# Patient Record
Sex: Female | Born: 1990 | Race: White | Hispanic: No | Marital: Single | Smoking: Never smoker
Health system: Southern US, Community
[De-identification: ages and names within clinical notes are randomized; demographics above are authoritative.]

## PROBLEM LIST (undated history)

## (undated) DIAGNOSIS — J45909 Unspecified asthma, uncomplicated: Secondary | ICD-10-CM

## (undated) HISTORY — DX: Unspecified asthma, uncomplicated: J45.909

---
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
## 2010-03-17 IMAGING — CR DG CHEST 2V
1 series · 5 of 5 positions shown · non-contrast
Comparison: NONE

CLINICAL DATA: Right anterior rib pain. 

CHEST TWO VIEW (PA AND LATERAL)

[Series 1: view not recorded · 0.17mm/px · 5 of 5 slices shown]
[im 1/5]
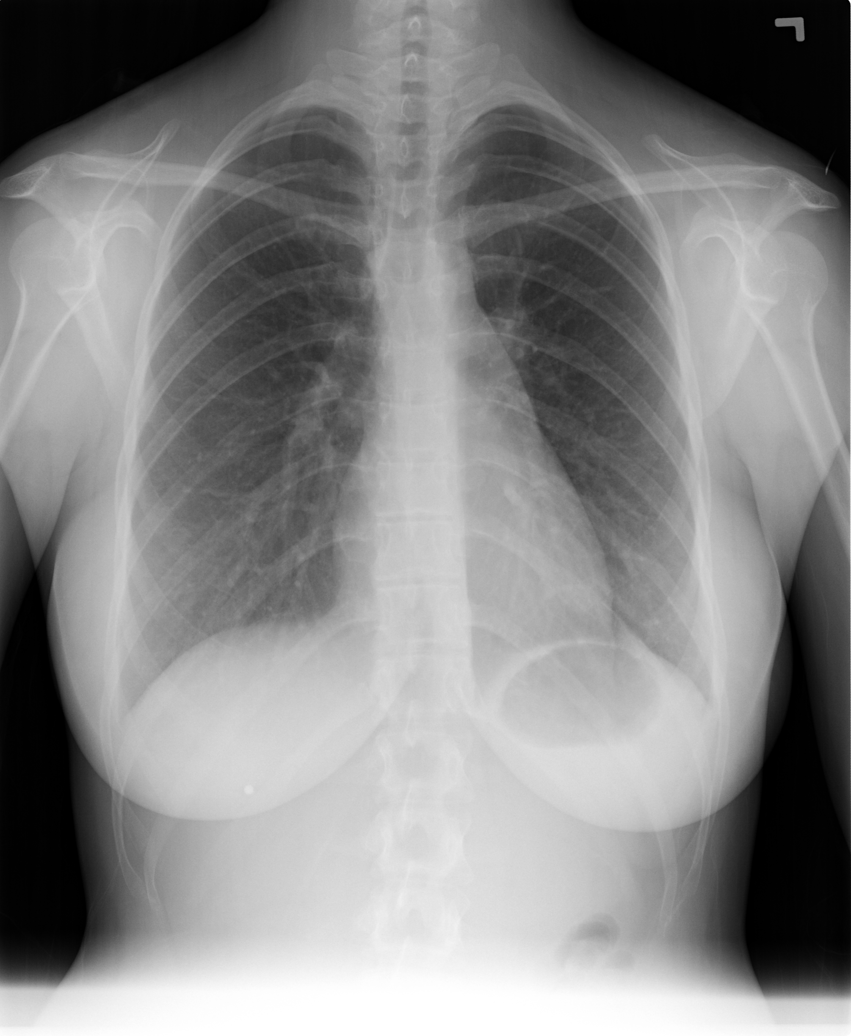
[im 2/5]
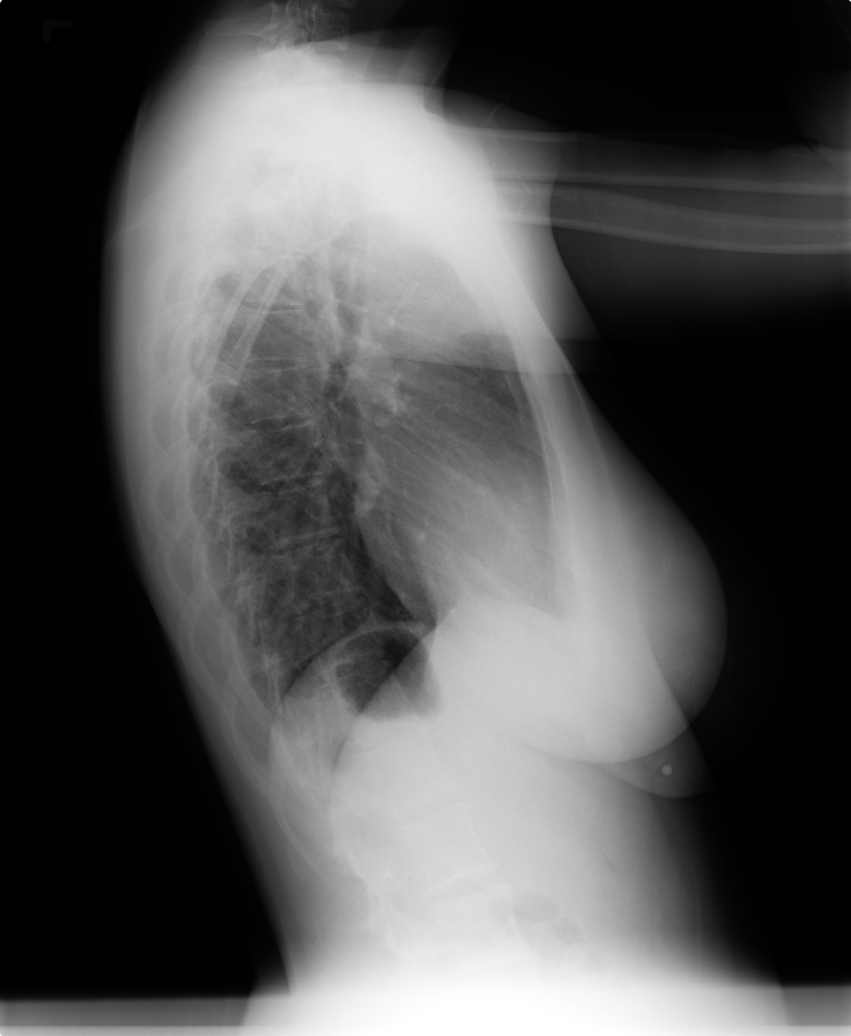
[im 3/5]
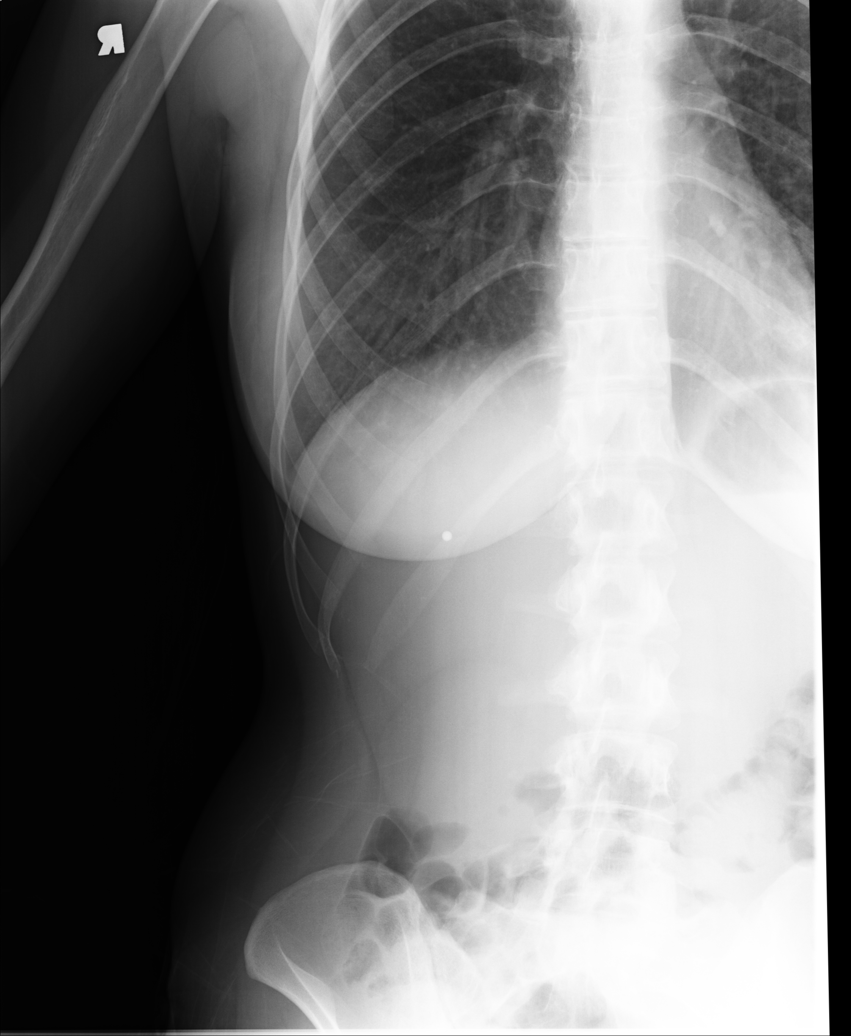
[im 4/5]
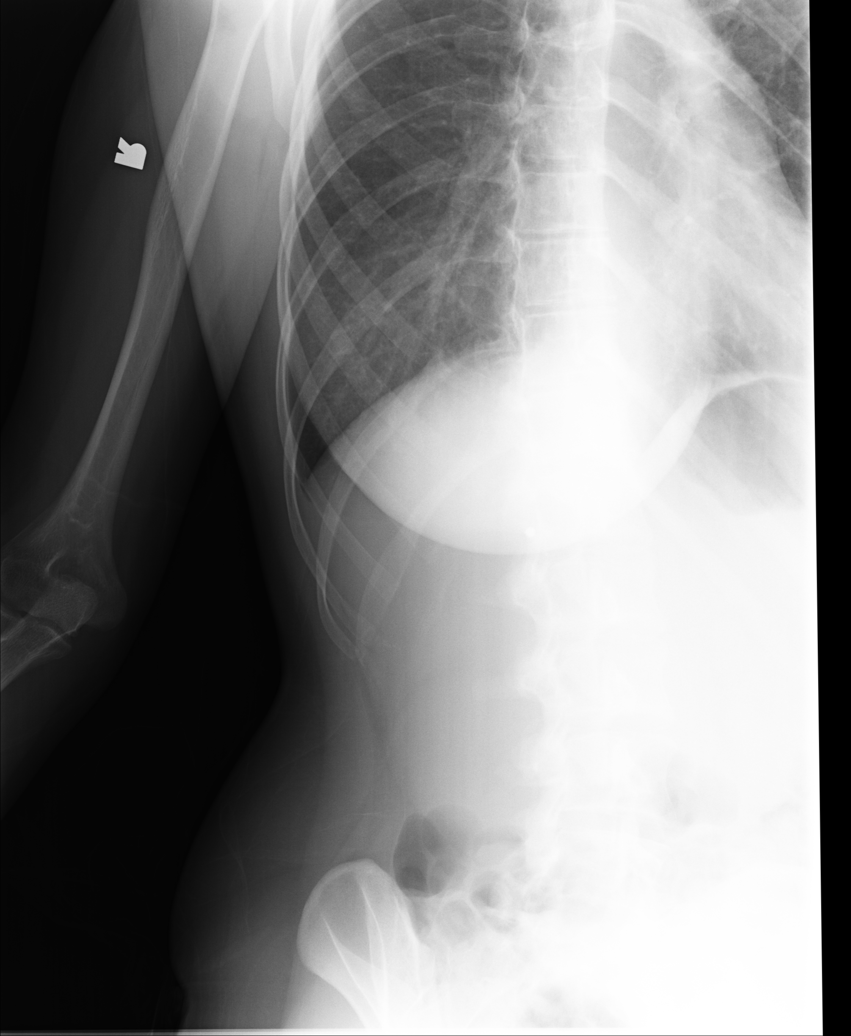
[im 5/5]
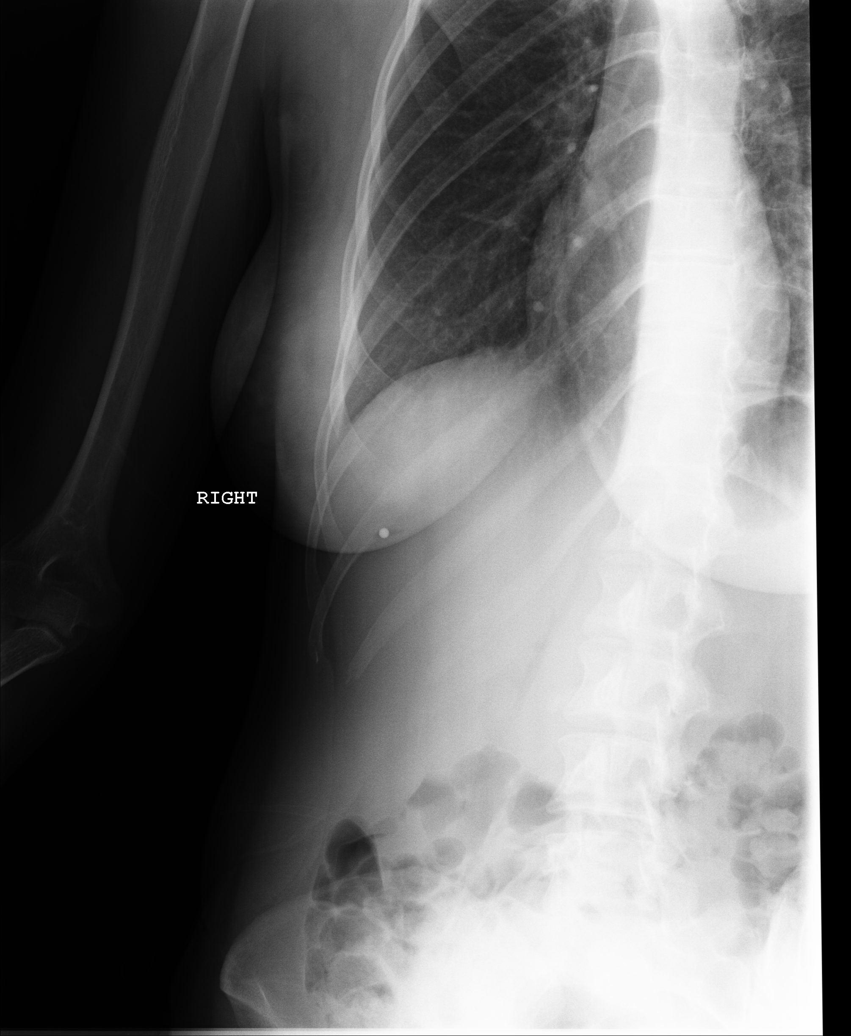

[5 of 5 positions shown; findings below may reference images not displayed]

FINDINGS: The lungs are clear and well expanded.   Heart and 
pulmonary vessels are normal.  No significant abnormalities are 
noted in the regional skeleton. 

electronically reviewed on 01/02/2008 Dict Date: 01/02/2008  Tran 
Date:  01/02/2008 YAMUANT ITICHA

## 2013-05-13 ENCOUNTER — Other Ambulatory Visit (HOSPITAL_COMMUNITY)
Admission: RE | Admit: 2013-05-13 | Discharge: 2013-05-13 | Disposition: A | Discharge status: Home / Self Care | Payer: 59 | Source: Ambulatory Visit | Attending: Family Medicine | Admitting: Family Medicine

## 2013-05-13 ENCOUNTER — Other Ambulatory Visit: Payer: Self-pay | Admitting: Family Medicine

## 2013-05-13 DIAGNOSIS — Z124 Encounter for screening for malignant neoplasm of cervix: Secondary | ICD-10-CM | POA: Insufficient documentation

## 2013-05-13 DIAGNOSIS — Z113 Encounter for screening for infections with a predominantly sexual mode of transmission: Secondary | ICD-10-CM | POA: Insufficient documentation

## 2015-08-20 ENCOUNTER — Other Ambulatory Visit: Payer: Self-pay | Admitting: Family Medicine

## 2015-08-20 ENCOUNTER — Other Ambulatory Visit (HOSPITAL_COMMUNITY)
Admission: RE | Admit: 2015-08-20 | Discharge: 2015-08-20 | Disposition: A | Discharge status: Home / Self Care | Payer: BLUE CROSS/BLUE SHIELD | Source: Ambulatory Visit | Attending: Family Medicine | Admitting: Family Medicine

## 2015-08-20 DIAGNOSIS — Z01411 Encounter for gynecological examination (general) (routine) with abnormal findings: Secondary | ICD-10-CM | POA: Diagnosis present

## 2015-08-25 LAB — CYTOLOGY - PAP

## 2016-12-10 ENCOUNTER — Emergency Department (HOSPITAL_COMMUNITY)
Admission: EM | Admit: 2016-12-10 | Discharge: 2016-12-10 | Disposition: A | Discharge status: Home / Self Care | Payer: Worker's Compensation | Attending: Emergency Medicine | Admitting: Emergency Medicine

## 2016-12-10 DIAGNOSIS — W540XXA Bitten by dog, initial encounter: Secondary | ICD-10-CM | POA: Diagnosis not present

## 2016-12-10 DIAGNOSIS — S0125XA Open bite of nose, initial encounter: Secondary | ICD-10-CM | POA: Diagnosis present

## 2016-12-10 DIAGNOSIS — Y939 Activity, unspecified: Secondary | ICD-10-CM | POA: Diagnosis not present

## 2016-12-10 DIAGNOSIS — Y999 Unspecified external cause status: Secondary | ICD-10-CM | POA: Insufficient documentation

## 2016-12-10 DIAGNOSIS — S0121XA Laceration without foreign body of nose, initial encounter: Secondary | ICD-10-CM | POA: Diagnosis not present

## 2016-12-10 DIAGNOSIS — Y929 Unspecified place or not applicable: Secondary | ICD-10-CM | POA: Diagnosis not present

## 2016-12-10 MED ORDER — FLUCONAZOLE 150 MG PO TABS: 150.0000 mg | ORAL_TABLET | Freq: Every day | ORAL | 0 refills | Status: AC

## 2016-12-10 MED ORDER — AMOXICILLIN-POT CLAVULANATE 875-125 MG PO TABS: 1.0000 | ORAL_TABLET | Freq: Two times a day (BID) | ORAL | 0 refills | Status: DC

## 2016-12-10 MED ORDER — LIDOCAINE-EPINEPHRINE (PF) 2 %-1:200000 IJ SOLN
10.0000 mL | Freq: Once | INTRAMUSCULAR | Status: AC
  Administered 2016-12-10: 10 mL via INTRADERMAL
  Filled 2016-12-10: qty 20

## 2016-12-10 NOTE — ED Triage Notes (Addendum)
Pt here for dog bite. Pt works at [employer] and was working with a terrier and the dog bit her in the nose. Pt has skin flap on nose, seen at [facility] and sent here stating "they sent me here to see if I need sutures and possibly a plastic surgeon" VSS. Pt was given rocephin. Dog was up to date on vaccinations.

## 2016-12-10 NOTE — Discharge Instructions (Signed)
Please read and follow all provided instructions.  Your diagnoses today include:  1. Dog bite, initial encounter   2. Laceration of nose, initial encounter     Tests performed today include: X-ray of the affected area that did not show any foreign bodies or broken bones Vital signs. See below for your results today.   Medications prescribed:   Take any prescribed medications only as directed.   Home care instructions:  Follow any educational materials and wound care instructions contained in this packet.   You may shower and wash the area with soap and water, just be sure to pat the area dry and not rub over the stitches. Do no put your stiches underwater (in a bath, pool, or lake). Getting stiches wet can slow down healing and increase your chances of getting an infection. You may apply Bacitracin or Neosporin twice a day for 7 days, and keep the ara clean with  bandage or gauze. Do not apply alcohol or hydrogen peroxide. Cover the area if it draining or weeping.   Follow-up instructions: Suture Removal: Return to the Emergency Department or see your primary care care doctor in 5-7 days for a recheck of your wound and removal of your sutures or staples.    Return instructions:  Return to the Emergency Department if you have: Fever Worsening pain Worsening swelling of the wound Pus draining from the wound Redness of the skin that moves away from the wound, especially if it streaks away from the affected area  Any other emergent concerns  Your vital signs today were: BP 126/89 (BP Location: Left Arm)    Pulse (!) 108    Temp 98.3 F (36.8 C) (Oral)    Resp 18    Ht  (1.6 m)    Wt 69.9 kg    LMP 11/03/2016 (Approximate)    SpO2 95%    BMI 27.28 kg/m  If your blood pressure (BP) was elevated above 135/85 this visit, please have this repeated by your doctor within one month. --------------

## 2016-12-10 NOTE — ED Provider Notes (Signed)
[facility]-EMERGENCY DEPT Provider Note   CSN: [id number] Arrival date & time: 12/10/16  1941   By signing my name below, I, Teofilo Pod, attest that this documentation has been prepared under the direction and in the presence of Audry Pili, PA-C. Electronically Signed: Teofilo Pod, ED Scribe. 12/10/2016. 8:12 PM.   History   Chief Complaint Chief Complaint  Patient presents with  . Animal Bite    The history is provided by the patient. No language interpreter was used.   HPI Comments:  Ruth Mendez is a 26 y.o. female who presents to the Emergency Department complaining of a dog bite to her nose that she sustained 5 hours ago. Pt reports that she was handling a terrier at [employer] and the dog bit on the nose. Pt was seen at [facility] after the bite and was given rocephin and was referred to the ED. Pt's vaccinations UTD. The dog was up to date on its vaccinations. Bleeding controlled with a pressure dressing. Pt denies other associated symptoms.   No past medical history on file.  There are no active problems to display for this patient.   No past surgical history on file.  OB History    No data available       Home Medications    Prior to Admission medications   Not on File    Family History No family history on file.  Social History Social History  Substance Use Topics  . Smoking status: Not on file  . Smokeless tobacco: Not on file  . Alcohol use Not on file     Allergies   Patient has no known allergies.   Review of Systems Review of Systems  Constitutional: Negative for fever.  HENT: Negative for nosebleeds.   Skin: Positive for wound.     Physical Exam Updated Vital Signs BP 126/89 (BP Location: Left Arm)   Pulse (!) 108   Temp 98.3 F (36.8 C) (Oral)   Resp 18   Ht  (1.6 m)   Wt 154 lb (69.9 kg)   LMP 11/03/2016 (Approximate)   SpO2 95%   BMI 27.28 kg/m   Physical Exam  Constitutional: She appears  well-developed and well-nourished. No distress.  HENT:  Head: Normocephalic and atraumatic.  1cm jagged superficial laceration on tip of nose. Minor skin evulsion. Bottom of wound visualized. Bleeding controlled.   Eyes: Conjunctivae are normal.  Cardiovascular: Normal rate.   Pulmonary/Chest: Effort normal.  Abdominal: She exhibits no distension.  Neurological: She is alert.  Skin: Skin is warm and dry.  Psychiatric: She has a normal mood and affect.  Nursing note and vitals reviewed.  ED Treatments / Results  DIAGNOSTIC STUDIES:  Oxygen Saturation is 95% on RA, normal by my interpretation.    COORDINATION OF CARE:  8:10 PM Discussed treatment plan with pt at bedside and pt agreed to plan.   Labs (all labs ordered are listed, but only abnormal results are displayed) Labs Reviewed - No data to display  EKG  EKG Interpretation None       Radiology No results found.  Procedures .Marland KitchenLaceration Repair Date/Time: 12/10/2016 9:23 PM Performed by: Audry Pili Authorized by: Audry Pili   Consent:    Consent obtained:  Verbal   Consent given by:  Patient Laceration details:    Location:  Face   Face location:  Nose   Length (cm):  1 Repair type:    Repair type:  Simple Skin repair:  Repair method:  Sutures   Suture size:  6-0   Suture material:  Prolene   Number of sutures:  6 Approximation:    Approximation:  Close    (including critical care time)  Medications Ordered in ED Medications  lidocaine-EPINEPHrine (XYLOCAINE W/EPI) 2 %-1:200000 (PF) injection 10 mL (10 mLs Intradermal Given 12/10/16 2018)     Initial Impression / Assessment and Plan / ED Course  I have reviewed the triage vital signs and the nursing notes.  Pertinent labs & imaging results that were available during my care of the patient were reviewed by me and considered in my medical decision making (see chart for details).  Final Clinical Impressions(s) / ED Diagnoses     {I have  reviewed the relevant previous healthcare records.  {I obtained HPI from historian.   ED Course:  Assessment: Patient is a 26 y.o. female that presents with laceration to tip of nose. Tdap booster UTD. Pressure irrigation performed. Bottom of the wound visualized with bleeding controled. Laceration occurred < 8 hours prior to repair which was well tolerated. Allowed loose suturing to provide drainage. Pt has no co morbidities to effect normal wound healing. Discussed suture home care w pt and answered questions. Pt to f-u for wound check and suture removal in 5-7 days. Six 6-0 Prolene used.  Given Rx Augmentin. Pt is hemodynamically stable w no complaints prior to dc.    Disposition/Plan:  DC Home Additional Verbal discharge instructions given and discussed with patient.  Pt Instructed to f/u with PCP in the next week for evaluation and treatment of symptoms. Return precautions given Pt acknowledges and agrees with plan  Supervising Physician Alvira Monday, MD  Final diagnoses:  Dog bite, initial encounter  Laceration of nose, initial encounter    New Prescriptions New Prescriptions   No medications on file   I personally performed the services described in this documentation, which was scribed in my presence. The recorded information has been reviewed and is accurate.      Audry Pili, PA-C 12/10/16 0454    Alvira Monday, MD 12/12/16 1212

## 2018-05-28 DIAGNOSIS — F900 Attention-deficit hyperactivity disorder, predominantly inattentive type: Secondary | ICD-10-CM | POA: Diagnosis not present

## 2018-05-28 DIAGNOSIS — N3000 Acute cystitis without hematuria: Secondary | ICD-10-CM | POA: Diagnosis not present

## 2018-09-03 DIAGNOSIS — J988 Other specified respiratory disorders: Secondary | ICD-10-CM | POA: Diagnosis not present

## 2018-09-17 DIAGNOSIS — R0789 Other chest pain: Secondary | ICD-10-CM | POA: Diagnosis not present

## 2018-09-17 DIAGNOSIS — R05 Cough: Secondary | ICD-10-CM | POA: Diagnosis not present

## 2018-09-18 DIAGNOSIS — R0789 Other chest pain: Secondary | ICD-10-CM | POA: Diagnosis not present

## 2018-09-25 ENCOUNTER — Other Ambulatory Visit: Payer: Self-pay | Admitting: Family Medicine

## 2018-09-25 ENCOUNTER — Other Ambulatory Visit (HOSPITAL_COMMUNITY)
Admission: RE | Admit: 2018-09-25 | Discharge: 2018-09-25 | Disposition: A | Discharge status: Home / Self Care | Payer: BLUE CROSS/BLUE SHIELD | Source: Ambulatory Visit | Attending: Family Medicine | Admitting: Family Medicine

## 2018-09-25 DIAGNOSIS — Z01411 Encounter for gynecological examination (general) (routine) with abnormal findings: Secondary | ICD-10-CM | POA: Insufficient documentation

## 2018-09-25 DIAGNOSIS — Z79899 Other long term (current) drug therapy: Secondary | ICD-10-CM | POA: Diagnosis not present

## 2018-09-25 DIAGNOSIS — Z0000 Encounter for general adult medical examination without abnormal findings: Secondary | ICD-10-CM | POA: Diagnosis not present

## 2018-10-01 LAB — CYTOLOGY - PAP: Diagnosis: NEGATIVE

## 2019-06-13 DIAGNOSIS — M778 Other enthesopathies, not elsewhere classified: Secondary | ICD-10-CM | POA: Diagnosis not present

## 2019-10-01 DIAGNOSIS — Z309 Encounter for contraceptive management, unspecified: Secondary | ICD-10-CM | POA: Diagnosis not present

## 2019-12-23 DIAGNOSIS — R109 Unspecified abdominal pain: Secondary | ICD-10-CM | POA: Diagnosis not present

## 2019-12-24 ENCOUNTER — Ambulatory Visit
Admission: RE | Admit: 2019-12-24 | Discharge: 2019-12-24 | Disposition: A | Discharge status: Home / Self Care | Payer: BLUE CROSS/BLUE SHIELD | Source: Ambulatory Visit | Attending: Family Medicine | Admitting: Family Medicine

## 2019-12-24 ENCOUNTER — Other Ambulatory Visit: Payer: Self-pay

## 2019-12-24 ENCOUNTER — Other Ambulatory Visit: Payer: Self-pay | Admitting: Family Medicine

## 2019-12-24 DIAGNOSIS — R52 Pain, unspecified: Secondary | ICD-10-CM

## 2019-12-24 DIAGNOSIS — R109 Unspecified abdominal pain: Secondary | ICD-10-CM | POA: Diagnosis not present

## 2020-02-11 DIAGNOSIS — R480 Dyslexia and alexia: Secondary | ICD-10-CM | POA: Diagnosis not present

## 2020-02-11 DIAGNOSIS — F810 Specific reading disorder: Secondary | ICD-10-CM | POA: Diagnosis not present

## 2020-03-27 DIAGNOSIS — Z20822 Contact with and (suspected) exposure to covid-19: Secondary | ICD-10-CM | POA: Diagnosis not present

## 2020-05-18 DIAGNOSIS — Z20822 Contact with and (suspected) exposure to covid-19: Secondary | ICD-10-CM | POA: Diagnosis not present

## 2020-09-28 DIAGNOSIS — E559 Vitamin D deficiency, unspecified: Secondary | ICD-10-CM | POA: Diagnosis not present

## 2020-09-28 DIAGNOSIS — Z0000 Encounter for general adult medical examination without abnormal findings: Secondary | ICD-10-CM | POA: Diagnosis not present

## 2020-09-28 DIAGNOSIS — E7800 Pure hypercholesterolemia, unspecified: Secondary | ICD-10-CM | POA: Diagnosis not present

## 2021-03-18 DIAGNOSIS — R509 Fever, unspecified: Secondary | ICD-10-CM | POA: Diagnosis not present

## 2021-03-18 DIAGNOSIS — Z20822 Contact with and (suspected) exposure to covid-19: Secondary | ICD-10-CM | POA: Diagnosis not present

## 2021-03-18 DIAGNOSIS — B349 Viral infection, unspecified: Secondary | ICD-10-CM | POA: Diagnosis not present

## 2021-03-18 DIAGNOSIS — J029 Acute pharyngitis, unspecified: Secondary | ICD-10-CM | POA: Diagnosis not present

## 2021-03-21 DIAGNOSIS — J0100 Acute maxillary sinusitis, unspecified: Secondary | ICD-10-CM | POA: Diagnosis not present

## 2021-03-21 DIAGNOSIS — R062 Wheezing: Secondary | ICD-10-CM | POA: Diagnosis not present

## 2021-07-07 DIAGNOSIS — R051 Acute cough: Secondary | ICD-10-CM | POA: Diagnosis not present

## 2021-07-07 DIAGNOSIS — R509 Fever, unspecified: Secondary | ICD-10-CM | POA: Diagnosis not present

## 2021-07-07 DIAGNOSIS — Z20822 Contact with and (suspected) exposure to covid-19: Secondary | ICD-10-CM | POA: Diagnosis not present

## 2021-07-08 ENCOUNTER — Other Ambulatory Visit: Payer: Self-pay

## 2021-07-08 ENCOUNTER — Emergency Department (HOSPITAL_BASED_OUTPATIENT_CLINIC_OR_DEPARTMENT_OTHER)
Admission: EM | Admit: 2021-07-08 | Discharge: 2021-07-08 | Disposition: A | Discharge status: Home / Self Care | Payer: BC Managed Care – PPO | Attending: Emergency Medicine | Admitting: Emergency Medicine

## 2021-07-08 ENCOUNTER — Encounter (HOSPITAL_BASED_OUTPATIENT_CLINIC_OR_DEPARTMENT_OTHER): Payer: Self-pay

## 2021-07-08 DIAGNOSIS — R000 Tachycardia, unspecified: Secondary | ICD-10-CM | POA: Insufficient documentation

## 2021-07-08 DIAGNOSIS — Z20822 Contact with and (suspected) exposure to covid-19: Secondary | ICD-10-CM | POA: Diagnosis not present

## 2021-07-08 DIAGNOSIS — R509 Fever, unspecified: Secondary | ICD-10-CM | POA: Diagnosis not present

## 2021-07-08 DIAGNOSIS — J101 Influenza due to other identified influenza virus with other respiratory manifestations: Secondary | ICD-10-CM | POA: Insufficient documentation

## 2021-07-08 LAB — RESP PANEL BY RT-PCR (FLU A&B, COVID) ARPGX2
Influenza A by PCR: POSITIVE — AB
Influenza B by PCR: NEGATIVE
SARS Coronavirus 2 by RT PCR: NEGATIVE

## 2021-07-08 MED ORDER — IBUPROFEN 800 MG PO TABS
800.0000 mg | ORAL_TABLET | Freq: Once | ORAL | Status: AC
  Administered 2021-07-08: 14:00:00 800 mg via ORAL
  Filled 2021-07-08: qty 1

## 2021-07-08 MED ORDER — ACETAMINOPHEN 325 MG PO TABS
650.0000 mg | ORAL_TABLET | Freq: Once | ORAL | Status: AC | PRN
  Administered 2021-07-08: 12:00:00 650 mg via ORAL

## 2021-07-08 NOTE — ED Provider Notes (Signed)
[facility] EMERGENCY DEPT Provider Note   CSN: [id number] Arrival date & time: 07/08/21  1159     History Chief Complaint  Patient presents with   Fever    Ruth Mendez is a 30 y.o. female.  HPI Patient reports that she started getting sick yesterday.  She has had a mild cough and was having body aches and fevers.  She reports mild sore throat.  No vomiting.  Patient reports she is tolerating liquids.  She denies she is getting dyspnea on exertion.  She does not feel short of breath or have chest pain.  Patient reports she was seen at urgent care yesterday and tested negative for flu at that time.  She was febrile and advised to control fever and hydrate.  If heart rate still elevated she was advised to return for recheck.  She reports heart rate has still been elevated at times up to 120s or 130s..  She does not feel lightheaded or near syncopal with this heart rate.  She was sent to the emergency department for further evaluation for persisting tachycardia.  Patient denies any significant medical history.  She denies any history of asthma lung or heart disease.  She reports she does have polycystic ovary and    History reviewed. No pertinent past medical history.  There are no problems to display for this patient.   History reviewed. No pertinent surgical history.   OB History   No obstetric history on file.     No family history on file.  Social History   Tobacco Use   Smoking status: Never   Smokeless tobacco: Never  Substance Use Topics   Alcohol use: Yes    Comment: Occasionally   Drug use: Never    Home Medications Prior to Admission medications   Medication Sig Start Date End Date Taking? Authorizing Provider  norethindrone-ethinyl estradiol-FE (LOESTRIN FE) 1-20 MG-MCG tablet Take 1 tablet by mouth daily. 02/16/21  Yes [provider]  sodium fluoride (FLUORISHIELD) 1.1 % GEL dental gel See admin instructions. 02/26/21  Yes [provider]  amoxicillin-clavulanate (AUGMENTIN) 875-125 MG tablet Take 1 tablet by mouth every 12 (twelve) hours. Patient not taking: Reported on 07/08/2021 12/10/16   Audry Pili, PA-C    Allergies    Patient has no known allergies.  Review of Systems   Review of Systems 10 systems reviewed and negative except as per HPI Physical Exam Updated Vital Signs BP 126/80   Pulse (!) 124   Temp 99.5 F (37.5 C) (Oral)   Resp (!) 24   Ht 5\' 3"  (1.6 m)   Wt 79.4 kg   SpO2 94%   BMI 31.00 kg/m   Physical Exam Constitutional:      Appearance: Normal appearance.  HENT:     Mouth/Throat:     Pharynx: Oropharynx is clear.  Eyes:     Extraocular Movements: Extraocular movements intact.  Cardiovascular:     Comments: Borderline tachycardia rates between 110-120.  No rub murmur gallop. Pulmonary:     Effort: Pulmonary effort is normal.     Breath sounds: Normal breath sounds.  Abdominal:     General: There is no distension.     Palpations: Abdomen is soft.     Tenderness: There is no abdominal tenderness. There is no guarding.  Musculoskeletal:        General: No swelling or tenderness. Normal range of motion.     Cervical back: Neck supple.     Right lower  leg: No edema.     Left lower leg: No edema.  Skin:    General: Skin is warm and dry.     Capillary Refill: Capillary refill takes less than 2 seconds.  Neurological:     General: No focal deficit present.     Mental Status: She is alert and oriented to person, place, and time.     Coordination: Coordination normal.  Psychiatric:        Mood and Affect: Mood normal.    ED Results / Procedures / Treatments   Labs (all labs ordered are listed, but only abnormal results are displayed) Labs Reviewed  RESP PANEL BY RT-PCR (FLU A&B, COVID) ARPGX2 - Abnormal; Notable for the following components:      Result Value   Influenza A by PCR POSITIVE (*)    All other components within normal limits    EKG EKG  Interpretation  Date/Time:  Thursday July 08 2021 14:33:04 EST Ventricular Rate:  116 PR Interval:  165 QRS Duration: 93 QT Interval:  297 QTC Calculation: 413 R Axis:   94 Text Interpretation: Sinus tachycardia sinus tachycardia otherwise normal. no old comparison Confirmed by Arby Barrette [phone]) on 07/08/2021 3:59:23 PM  Radiology No results found.  Procedures Procedures   Medications Ordered in ED Medications  acetaminophen (TYLENOL) tablet 650 mg (650 mg Oral Given 07/08/21 1222)  ibuprofen (ADVIL) tablet 800 mg (800 mg Oral Given 07/08/21 1428)    ED Course  I have reviewed the triage vital signs and the nursing notes.  Pertinent labs & imaging results that were available during my care of the patient were reviewed by me and considered in my medical decision making (see chart for details).    MDM Rules/Calculators/A&P                           Patient presents as outlined.  She is had approximately 2 days of fever body aches and cough.  She denies chest pain or shortness of breath.Patient was seen yesterday at urgent care.  There was concern for tachycardia.  Patient is febrile at time of presentation.  She does test positive for influenza A.  Clinically she is otherwise well in appearance.  She does not have peripheral edema or calf tenderness.  No suggestion at this time of pulmonary embolus.  Legs nontender, no active risk factors.  At this time patient is otherwise well in appearance and tolerating fluids, no chest pain or shortness of breath. I do feel she stable for home management with antipyretics and hydrating.  Return precautions reviewed. Final Clinical Impression(s) / ED Diagnoses Final diagnoses:  Influenza A  Tachycardia    Rx / DC Orders ED Discharge Orders     None        Arby Barrette, MD 07/08/21 1607

## 2021-07-08 NOTE — ED Triage Notes (Addendum)
Patient here POV from Home with Fever.  Patient states she felt ill yesterday and went to UC and was tested for Flu. Patient was hypertensive and was instructed to increase PO fluids and return if still hypertensive.  Patient was instructed to go to ED for Evaluation today.  NAD Noted during Triage. A&Ox4. GCS 15. Ambulatory.

## 2021-07-08 NOTE — Discharge Instructions (Signed)
1.  At this time I suspect your increased heart rate is due to fever and acute illness with influenza. 2.  Rest and hydrate well at home.  Take extra strength Tylenol every 6 hours and ibuprofen 600 mg every 8 hours for control of fever body aches and pain. 3.  Return to the emergency department if you get chest pain, shortness of breath, vomiting and cannot stay hydrated or take ibuprofen and Tylenol or other concerning symptoms.

## 2021-10-18 ENCOUNTER — Other Ambulatory Visit: Payer: Self-pay | Admitting: Family Medicine

## 2021-10-18 ENCOUNTER — Other Ambulatory Visit (HOSPITAL_COMMUNITY)
Admission: RE | Admit: 2021-10-18 | Discharge: 2021-10-18 | Disposition: A | Discharge status: Home / Self Care | Payer: BC Managed Care – PPO | Source: Ambulatory Visit | Attending: Family Medicine | Admitting: Family Medicine

## 2021-10-18 DIAGNOSIS — E559 Vitamin D deficiency, unspecified: Secondary | ICD-10-CM | POA: Diagnosis not present

## 2021-10-18 DIAGNOSIS — Z01411 Encounter for gynecological examination (general) (routine) with abnormal findings: Secondary | ICD-10-CM | POA: Diagnosis not present

## 2021-10-18 DIAGNOSIS — E7800 Pure hypercholesterolemia, unspecified: Secondary | ICD-10-CM | POA: Diagnosis not present

## 2021-10-18 DIAGNOSIS — Z0000 Encounter for general adult medical examination without abnormal findings: Secondary | ICD-10-CM | POA: Diagnosis not present

## 2021-10-18 DIAGNOSIS — R21 Rash and other nonspecific skin eruption: Secondary | ICD-10-CM | POA: Diagnosis not present

## 2021-10-18 DIAGNOSIS — E282 Polycystic ovarian syndrome: Secondary | ICD-10-CM | POA: Diagnosis not present

## 2021-10-19 LAB — CYTOLOGY - PAP
Comment: NEGATIVE
Diagnosis: NEGATIVE
High risk HPV: NEGATIVE

## 2022-01-24 DIAGNOSIS — D485 Neoplasm of uncertain behavior of skin: Secondary | ICD-10-CM | POA: Diagnosis not present

## 2022-01-24 DIAGNOSIS — L570 Actinic keratosis: Secondary | ICD-10-CM | POA: Diagnosis not present

## 2022-01-24 DIAGNOSIS — L578 Other skin changes due to chronic exposure to nonionizing radiation: Secondary | ICD-10-CM | POA: Diagnosis not present

## 2022-01-24 DIAGNOSIS — L821 Other seborrheic keratosis: Secondary | ICD-10-CM | POA: Diagnosis not present

## 2022-01-24 DIAGNOSIS — D1801 Hemangioma of skin and subcutaneous tissue: Secondary | ICD-10-CM | POA: Diagnosis not present

## 2022-03-08 IMAGING — CR DG ABDOMEN 2V
2 series · 2 of 2 positions shown · non-contrast
Comparison: None.

CLINICAL DATA: Intermittent abdominal pain.

EXAM:
ABDOMEN - 2 VIEW

[w abdomen upright]
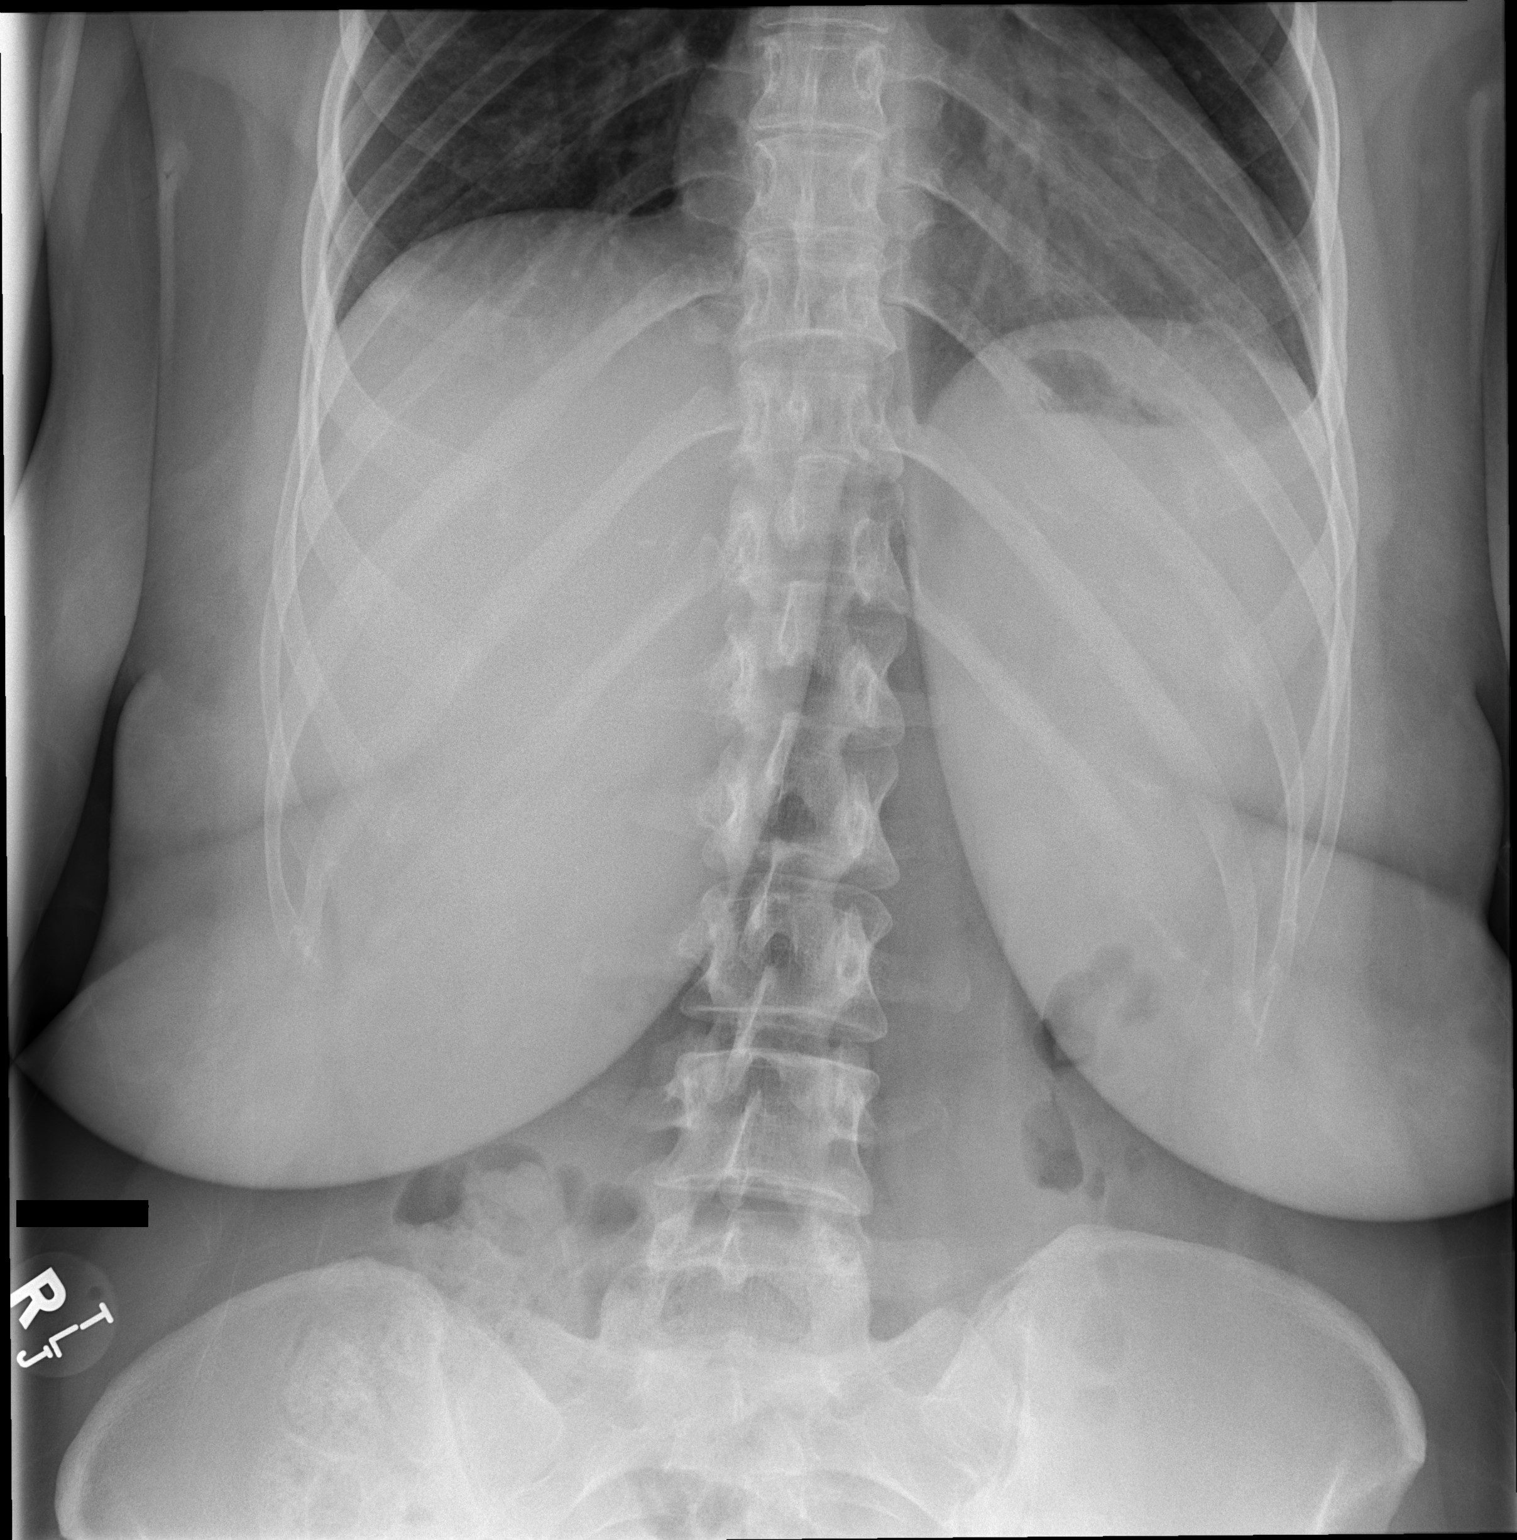

[t abdomen supine]
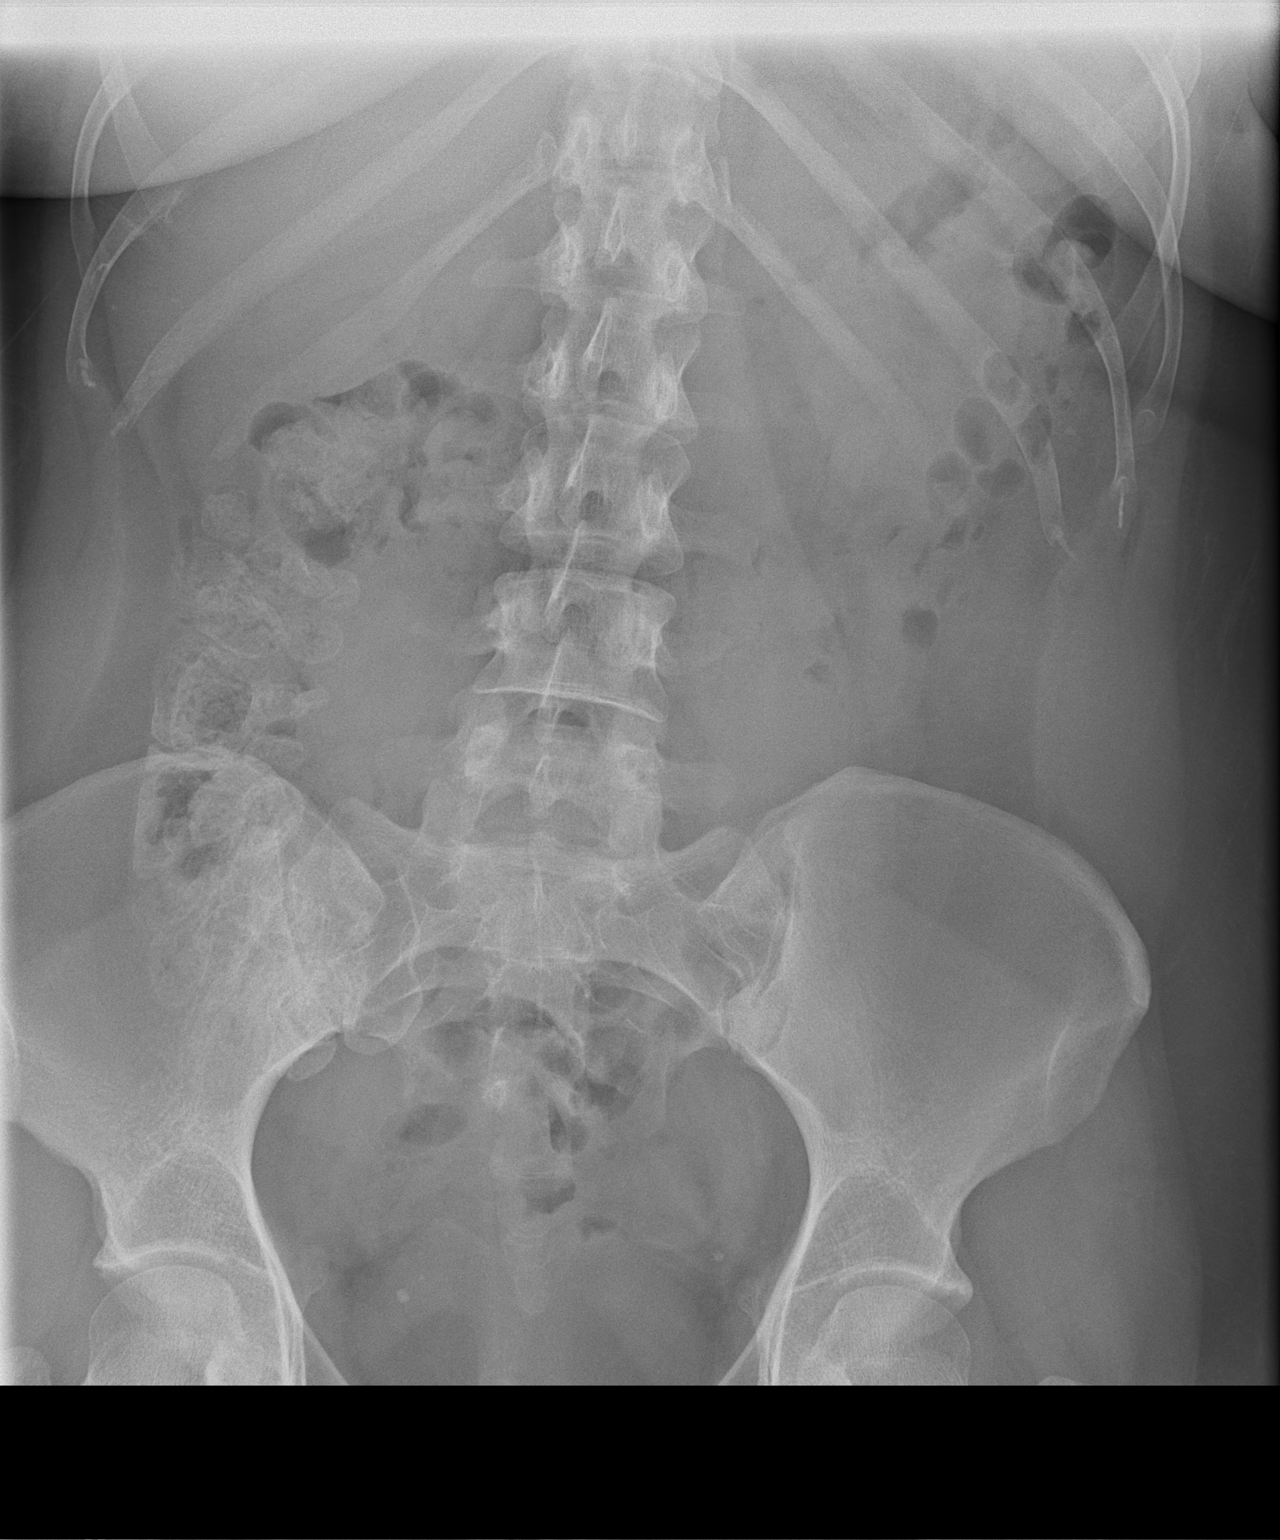

[2 of 2 positions shown; findings below may reference images not displayed]

FINDINGS: Normal bowel gas pattern. No bowel dilatation to suggest
obstruction. Small to moderate colonic stool burden. No radiopaque
calculi or abnormal soft tissue calcifications. There are pelvic
phleboliths. No concerning intraabdominal mass effect. Lung bases
are clear. Osseous structures are unremarkable.
IMPRESSION: Unremarkable radiographs of the abdomen.

## 2022-06-14 DIAGNOSIS — R052 Subacute cough: Secondary | ICD-10-CM | POA: Diagnosis not present

## 2022-06-14 DIAGNOSIS — J3089 Other allergic rhinitis: Secondary | ICD-10-CM | POA: Diagnosis not present

## 2022-06-14 DIAGNOSIS — L501 Idiopathic urticaria: Secondary | ICD-10-CM | POA: Diagnosis not present

## 2022-07-05 DIAGNOSIS — N939 Abnormal uterine and vaginal bleeding, unspecified: Secondary | ICD-10-CM | POA: Diagnosis not present

## 2022-08-14 DIAGNOSIS — R051 Acute cough: Secondary | ICD-10-CM | POA: Diagnosis not present

## 2022-10-04 DIAGNOSIS — J0140 Acute pansinusitis, unspecified: Secondary | ICD-10-CM | POA: Diagnosis not present

## 2022-12-01 ENCOUNTER — Emergency Department (HOSPITAL_COMMUNITY): Payer: 59

## 2022-12-01 ENCOUNTER — Encounter (HOSPITAL_COMMUNITY): Payer: Self-pay

## 2022-12-01 ENCOUNTER — Emergency Department (HOSPITAL_COMMUNITY)
Admission: EM | Admit: 2022-12-01 | Discharge: 2022-12-01 | Disposition: A | Discharge status: Home / Self Care | Payer: 59 | Attending: Emergency Medicine | Admitting: Emergency Medicine

## 2022-12-01 ENCOUNTER — Other Ambulatory Visit: Payer: Self-pay

## 2022-12-01 DIAGNOSIS — R531 Weakness: Secondary | ICD-10-CM | POA: Diagnosis not present

## 2022-12-01 DIAGNOSIS — R1111 Vomiting without nausea: Secondary | ICD-10-CM | POA: Diagnosis not present

## 2022-12-01 DIAGNOSIS — R110 Nausea: Secondary | ICD-10-CM | POA: Diagnosis not present

## 2022-12-01 DIAGNOSIS — T6594XA Toxic effect of unspecified substance, undetermined, initial encounter: Secondary | ICD-10-CM | POA: Insufficient documentation

## 2022-12-01 DIAGNOSIS — R112 Nausea with vomiting, unspecified: Secondary | ICD-10-CM | POA: Diagnosis not present

## 2022-12-01 DIAGNOSIS — F1210 Cannabis abuse, uncomplicated: Secondary | ICD-10-CM | POA: Diagnosis not present

## 2022-12-01 DIAGNOSIS — R42 Dizziness and giddiness: Secondary | ICD-10-CM | POA: Diagnosis not present

## 2022-12-01 DIAGNOSIS — T50901A Poisoning by unspecified drugs, medicaments and biological substances, accidental (unintentional), initial encounter: Secondary | ICD-10-CM | POA: Diagnosis not present

## 2022-12-01 LAB — COMPREHENSIVE METABOLIC PANEL
ALT: 23 U/L (ref 0–44)
AST: 22 U/L (ref 15–41)
Albumin: 3.8 g/dL (ref 3.5–5.0)
Alkaline Phosphatase: 64 U/L (ref 38–126)
Anion gap: 8 (ref 5–15)
BUN: 15 mg/dL (ref 6–20)
CO2: 24 mmol/L (ref 22–32)
Calcium: 8.7 mg/dL — ABNORMAL LOW (ref 8.9–10.3)
Chloride: 104 mmol/L (ref 98–111)
Creatinine, Ser: 0.63 mg/dL (ref 0.44–1.00)
GFR, Estimated: >60 mL/min (ref >60)
Glucose, Bld: 108 mg/dL — ABNORMAL HIGH (ref 70–99)
Potassium: 4 mmol/L (ref 3.5–5.1)
Sodium: 136 mmol/L (ref 135–145)
Total Bilirubin: 0.4 mg/dL (ref 0.3–1.2)
Total Protein: 7.6 g/dL (ref 6.5–8.1)

## 2022-12-01 LAB — CBC WITH DIFFERENTIAL/PLATELET
Abs Immature Granulocytes: 0.05 10*3/uL (ref 0.00–0.07)
Basophils Absolute: 0 10*3/uL (ref 0.0–0.1)
Basophils Relative: 0 %
Eosinophils Absolute: 0 10*3/uL (ref 0.0–0.5)
Eosinophils Relative: 0 %
HCT: 41.4 % (ref 36.0–46.0)
Hemoglobin: 13.2 g/dL (ref 12.0–15.0)
Immature Granulocytes: 1 %
Lymphocytes Relative: 9 %
Lymphs Abs: 1 10*3/uL (ref 0.7–4.0)
MCH: 27.2 pg (ref 26.0–34.0)
MCHC: 31.9 g/dL (ref 30.0–36.0)
MCV: 85.4 fL (ref 80.0–100.0)
Monocytes Absolute: 0.3 10*3/uL (ref 0.1–1.0)
Monocytes Relative: 3 %
Neutro Abs: 9.6 10*3/uL — ABNORMAL HIGH (ref 1.7–7.7)
Neutrophils Relative %: 87 %
Platelets: 282 10*3/uL (ref 150–400)
RBC: 4.85 MIL/uL (ref 3.87–5.11)
RDW: 12.9 % (ref 11.5–15.5)
WBC: 11 10*3/uL — ABNORMAL HIGH (ref 4.0–10.5)
nRBC: 0 % (ref 0.0–0.2)

## 2022-12-01 LAB — RAPID URINE DRUG SCREEN, HOSP PERFORMED
Amphetamines: NOT DETECTED
Barbiturates: NOT DETECTED
Benzodiazepines: NOT DETECTED
Cocaine: NOT DETECTED
Opiates: NOT DETECTED
Tetrahydrocannabinol: POSITIVE — AB

## 2022-12-01 LAB — ETHANOL: Alcohol, Ethyl (B): <10 mg/dL (ref <10)

## 2022-12-01 LAB — PREGNANCY, URINE: Preg Test, Ur: NEGATIVE

## 2022-12-01 LAB — CBG MONITORING, ED: Glucose-Capillary: 95 mg/dL (ref 70–99)

## 2022-12-01 LAB — SALICYLATE LEVEL: Salicylate Lvl: <7 mg/dL — ABNORMAL LOW (ref 7.0–30.0)

## 2022-12-01 LAB — ACETAMINOPHEN LEVEL: Acetaminophen (Tylenol), Serum: <10 ug/mL — ABNORMAL LOW (ref 10–30)

## 2022-12-01 MED ORDER — SODIUM CHLORIDE 0.9 % IV BOLUS
1000.0000 mL | Freq: Once | INTRAVENOUS | Status: AC
  Administered 2022-12-01: 1000 mL via INTRAVENOUS

## 2022-12-01 MED ORDER — SODIUM CHLORIDE 0.9 % IV SOLN: INTRAVENOUS | Status: DC

## 2022-12-01 NOTE — ED Notes (Signed)
Pt discharged home. Discussed discharge information with pt. No s/s observed during discharge.

## 2022-12-01 NOTE — Discharge Instructions (Addendum)
Your labs showed THC, likely related to the gummy that you ingested.  I would advise staying away from these in the future.  Please return as needed or should you experience any events of loss of consciousness or continued weakness.

## 2022-12-01 NOTE — ED Provider Notes (Signed)
[facility] EMERGENCY DEPARTMENT AT [facility] Provider Note   CSN: [id number] Arrival date & time: 12/01/22  8638     History  Chief Complaint  Patient presents with   Ingestion   Ruth Mendez is a 32 y.o. female.  Patient presents via EMS after ingesting half of CBD gummy around 2100 last night.  Denies any further ingestion or drug use/smoking.  EMS reports that she has been drowsy and complained of nausea/vomiting.  She was tachycardic upon EMS presentation which improved with IV fluids.  Her nausea also improved with being provided Zofran.  She currently denies any pain, shortness of breath, nausea, suicidal ideation.  Fianc present in room notes that she has taken Gummies infrequently in the past but never this brand.  He is unaware of any drug or medication use now or in the past. Denies known medical conditions. Notes she appeared weak at home leading him to call EMS and she was complaining of nausea.  The history is provided by the EMS personnel.     Home Medications Prior to Admission medications   Medication Sig Start Date End Date Taking? Authorizing Provider  amoxicillin-clavulanate (AUGMENTIN) 875-125 MG tablet Take 1 tablet by mouth every 12 (twelve) hours. Patient not taking: Reported on 07/08/2021 12/10/16   Audry Pili, PA-C  norethindrone-ethinyl estradiol-FE (LOESTRIN FE) 1-20 MG-MCG tablet Take 1 tablet by mouth daily. 02/16/21   [provider]  sodium fluoride (FLUORISHIELD) 1.1 % GEL dental gel See admin instructions. 02/26/21   [provider]   Allergies    Patient has no known allergies.    Review of Systems   Review of Systems  All other systems reviewed and are negative.  Physical Exam Updated Vital Signs BP 116/71   Pulse 95   Temp 98.7 F (37.1 C) (Oral)   Resp 19   LMP 11/28/2022   SpO2 93%  Physical Exam Constitutional:      Comments: Fatigued-appearing  HENT:     Head: Normocephalic and atraumatic.   Cardiovascular:     Rate and Rhythm: Normal rate and regular rhythm.     Heart sounds: Normal heart sounds.  Pulmonary:     Effort: Pulmonary effort is normal. No respiratory distress.     Breath sounds: Normal breath sounds.  Abdominal:     General: Bowel sounds are normal.     Palpations: Abdomen is soft.     Tenderness: There is no abdominal tenderness. There is no guarding.  Musculoskeletal:     Cervical back: Normal range of motion.  Neurological:     Mental Status: She is oriented to person, place, and time.     Cranial Nerves: No cranial nerve deficit.     Motor: No weakness.     Comments: Slow speech and slow but appropriate response to commands, GCS 15    ED Results / Procedures / Treatments   Labs (all labs ordered are listed, but only abnormal results are displayed) Labs Reviewed  COMPREHENSIVE METABOLIC PANEL - Abnormal; Notable for the following components:      Result Value   Glucose, Bld 108 (*)    Calcium 8.7 (*)    All other components within normal limits  SALICYLATE LEVEL - Abnormal; Notable for the following components:   Salicylate Lvl <7.0 (*)    All other components within normal limits  ACETAMINOPHEN LEVEL - Abnormal; Notable for the following components:   Acetaminophen (Tylenol), Serum <10 (*)    All other components within  normal limits  RAPID URINE DRUG SCREEN, HOSP PERFORMED - Abnormal; Notable for the following components:   Tetrahydrocannabinol POSITIVE (*)    All other components within normal limits  CBC WITH DIFFERENTIAL/PLATELET - Abnormal; Notable for the following components:   WBC 11.0 (*)    Neutro Abs 9.6 (*)    All other components within normal limits  ETHANOL  PREGNANCY, URINE  CBG MONITORING, ED    EKG EKG Interpretation  Date/Time:  Thursday December 01 2022 09:22:01 EDT Ventricular Rate:  94 PR Interval:  174 QRS Duration: 100 QT Interval:  342 QTC Calculation: 428 R Axis:   90 Text Interpretation: Sinus rhythm  Borderline right axis deviation ST elev, probable normal early repol pattern Since last tracing rate slower Confirmed by Jacalyn Lefevre [phone]) on 12/01/2022 9:45:27 AM  Radiology DG Chest Port 1 View  Result Date: 12/01/2022 CLINICAL DATA:  Dizziness, nausea/vomiting, weakness EXAM: PORTABLE CHEST 1 VIEW COMPARISON:  CXR 01/02/08 FINDINGS: No pleural effusion. No pneumothorax. Normal cardiac and mediastinal contours. Hazy left basilar airspace opacity could represent atelectasis or infection. No radiographically apparent displaced rib fractures. Visualized upper abdomen is unremarkable. IMPRESSION: Hazy left basilar airspace opacity could represent atelectasis or infection. Electronically Signed   By: Lorenza Cambridge M.D.   On: 12/01/2022 10:02    Procedures Procedures    Medications Ordered in ED Medications  0.9 %  sodium chloride infusion ( Intravenous New Bag/Given 12/01/22 1021)  sodium chloride 0.9 % bolus 1,000 mL (1,000 mLs Intravenous New Bag/Given 12/01/22 1405)    ED Course/ Medical Decision Making/ A&P                             Medical Decision Making 32 year old female presenting for concern of possible overdose secondary to CBD gummy ingestion without known history of drug use or suicidal ideation.  Differential diagnosis consist of ingestion/overdose, hypoglycemia, alcohol intoxication, seizure, stroke, CNS infection, electrolyte abnormalities, pneumonia, dehydration.  Presented with GCS 15 although sluggish and appropriate response with neurological physical exam. CXR revealed hazy left basilar airspace opacity could represent atelectasis or infection suspect chemical pneumonitis from emesis events not requiring antibiotic treatment at this time. Recommend follow up for this with PCP should symptoms develop.  CMP fairly unremarkable, negative ethanol, acetaminophen, salicylate however UDS positive for THC.  With rest and fluids, patient began turning to baseline.  She and fianc  were amenable to discharge home.  Amount and/or Complexity of Data Reviewed Labs: ordered. Radiology: ordered.  Risk Prescription drug management.         Final Clinical Impression(s) / ED Diagnoses Final diagnoses:  Ingestion of nontoxic substance, undetermined intent, initial encounter    Rx / DC Orders ED Discharge Orders     None         Shelby Mattocks, DO 12/01/22 1458    Jacalyn Lefevre, MD 12/06/22 1143

## 2022-12-01 NOTE — ED Triage Notes (Signed)
Patient coming from home via EMS with c/o dizziness, n/v, and generalized weakness after taking a CBD gummy last night around 2100. Vomiting started this morning.

## 2022-12-19 DIAGNOSIS — R55 Syncope and collapse: Secondary | ICD-10-CM | POA: Diagnosis not present

## 2023-02-20 ENCOUNTER — Ambulatory Visit: Payer: 59 | Admitting: Neurology

## 2023-10-19 ENCOUNTER — Ambulatory Visit: Payer: No Typology Code available for payment source | Admitting: Physician Assistant

## 2023-10-19 ENCOUNTER — Encounter: Payer: Self-pay | Admitting: Physician Assistant

## 2023-10-19 VITALS — BP 132/91 | HR 90 | Temp 98.2°F | Ht 63.0 in | Wt 183.0 lb

## 2023-10-19 DIAGNOSIS — J0100 Acute maxillary sinusitis, unspecified: Secondary | ICD-10-CM | POA: Diagnosis not present

## 2023-10-19 DIAGNOSIS — R051 Acute cough: Secondary | ICD-10-CM | POA: Diagnosis not present

## 2023-10-19 MED ORDER — HYDROCOD POLI-CHLORPHE POLI ER 10-8 MG/5ML PO SUER
5.0000 mL | Freq: Every evening | ORAL | 0 refills | Status: DC | PRN
Start: 2023-10-19 — End: 2023-10-20

## 2023-10-19 NOTE — Progress Notes (Signed)
 Established patient visit   Patient: Ruth Mendez   DOB: 1991-04-11   33 y.o. Female  MRN: [id number] Visit Date: 10/19/2023  Today's healthcare provider: Alfredia Ferguson, PA-C   Chief Complaint  Patient presents with   Establish Care    2-3 weeks with symptoms- went to UC and finished medication yesterday. Very congested and has cough, making throat sore. Slight body aches OTC- tylenol cold and flu.      Subjective    Pt reports sinus congestion with green nasal mucous, cough, fatigue, sore throat, elevated temp, t max 99.30F. taking tylenol cold and flu over the counter. She was seen at urgent care, given amoxicillin and prednisone. She reports initially feeling better, but then feeling worse again yesterday.  She had stopped the amoxicillin after 4 days as she felt it wasn't helping.  Medications: Outpatient Medications Prior to Visit  Medication Sig   albuterol (VENTOLIN HFA) 108 (90 Base) MCG/ACT inhaler Inhale 2 puffs into the lungs every 4 (four) hours as needed.   [DISCONTINUED] amoxicillin-clavulanate (AUGMENTIN) 875-125 MG tablet Take 1 tablet by mouth every 12 (twelve) hours. (Patient not taking: Reported on 07/08/2021)   [DISCONTINUED] norethindrone-ethinyl estradiol-FE (LOESTRIN FE) 1-20 MG-MCG tablet Take 1 tablet by mouth daily.   [DISCONTINUED] sodium fluoride (FLUORISHIELD) 1.1 % GEL dental gel See admin instructions.   No facility-administered medications prior to visit.    Review of Systems  Constitutional:  Positive for fatigue. Negative for fever.  HENT:  Positive for congestion, postnasal drip, rhinorrhea and sore throat.   Respiratory:  Positive for cough. Negative for shortness of breath.   Cardiovascular:  Negative for chest pain and leg swelling.  Gastrointestinal:  Negative for abdominal pain.  Neurological:  Negative for dizziness and headaches.       Objective    BP (!) 132/91   Pulse 90   Temp 98.2 F (36.8 C) (Oral)   Ht 5\' 3"   (1.6 m)   Wt 183 lb (83 kg)   SpO2 94%   BMI 32.42 kg/m    Physical Exam Constitutional:      General: She is awake.     Appearance: She is well-developed.  HENT:     Head: Normocephalic.     Right Ear: Tympanic membrane normal.     Ears:     Comments: Left TM bulging with clear fluid    Mouth/Throat:     Pharynx: Posterior oropharyngeal erythema present. No oropharyngeal exudate.  Eyes:     Conjunctiva/sclera: Conjunctivae normal.  Cardiovascular:     Rate and Rhythm: Normal rate and regular rhythm.     Heart sounds: Normal heart sounds.  Pulmonary:     Effort: Pulmonary effort is normal.     Breath sounds: Normal breath sounds.  Skin:    General: Skin is warm.  Neurological:     Mental Status: She is alert and oriented to person, place, and time.  Psychiatric:        Attention and Perception: Attention normal.        Mood and Affect: Mood normal.        Speech: Speech normal.        Behavior: Behavior is cooperative.      No results found for any visits on 10/19/23.  Assessment & Plan    Acute non-recurrent maxillary sinusitis  Acute cough -     Hydrocod Poli-Chlorphe Poli ER; Take 5 mLs by mouth at bedtime as needed for cough.  Dispense:  115 mL; Refill: 0   Advised pt she likely felt better initially because she was taking the amoxicillin....recommending resuming for a total of 10 days. Otc antihistamines, rx tussionex for cough. Recommend hydrating.  Return if symptoms worsen or fail to improve.       Alfredia Ferguson, PA-C  [facility] [phone] (phone) [phone] (fax)  [facility]

## 2023-10-20 ENCOUNTER — Other Ambulatory Visit: Payer: Self-pay | Admitting: Physician Assistant

## 2023-10-20 ENCOUNTER — Other Ambulatory Visit (HOSPITAL_BASED_OUTPATIENT_CLINIC_OR_DEPARTMENT_OTHER): Payer: Self-pay

## 2023-10-20 DIAGNOSIS — R051 Acute cough: Secondary | ICD-10-CM

## 2023-10-20 MED ORDER — HYDROCOD POLI-CHLORPHE POLI ER 10-8 MG/5ML PO SUER
5.0000 mL | Freq: Every evening | ORAL | 0 refills | Status: AC | PRN
Start: 2023-10-20 — End: ?
  Filled 2023-10-20: qty 25, 5d supply, fill #0

## 2023-10-20 NOTE — Telephone Encounter (Signed)
 You sent yesterday, please sign denial.

## 2023-10-20 NOTE — Telephone Encounter (Signed)
 Please see note below: pt requesting script be sent to alternate pharmacy as it is out of stock at [facility]. Medication pended to alternate pharmacy. Routing to clinic for review.  Copied from CRM [id number]. Topic: Clinical - Prescription Issue >> Oct 20, 2023  1:39 PM Alona Bene A wrote: Reason for CRM: Patient called in stating that [facility] does not carry chlorpheniramine-HYDROcodone (TUSSIONEX) 10-8 MG/5ML . Patient is asking that we send to pharmacy inside office location. Please contact patient to advise if medication can be sent to this location. Patient can be reached at [phone].
